# Patient Record
Sex: Female | Born: 1985 | Race: White | Hispanic: No | Marital: Married
Health system: Southern US, Community
[De-identification: ages and names within clinical notes are randomized; demographics above are authoritative.]

---

## 2014-10-10 ENCOUNTER — Encounter (HOSPITAL_COMMUNITY): Payer: Self-pay | Admitting: Obstetrics and Gynecology

## 2014-10-10 ENCOUNTER — Other Ambulatory Visit (HOSPITAL_COMMUNITY): Payer: Self-pay | Admitting: Obstetrics and Gynecology

## 2014-10-10 DIAGNOSIS — O283 Abnormal ultrasonic finding on antenatal screening of mother: Secondary | ICD-10-CM

## 2014-10-10 DIAGNOSIS — Z3689 Encounter for other specified antenatal screening: Secondary | ICD-10-CM

## 2014-10-10 DIAGNOSIS — Z3A18 18 weeks gestation of pregnancy: Secondary | ICD-10-CM

## 2014-10-16 ENCOUNTER — Encounter (HOSPITAL_COMMUNITY): Payer: Self-pay

## 2014-10-16 ENCOUNTER — Other Ambulatory Visit (HOSPITAL_COMMUNITY): Payer: Self-pay | Admitting: Obstetrics and Gynecology

## 2014-10-16 ENCOUNTER — Ambulatory Visit (HOSPITAL_COMMUNITY)
Admission: RE | Admit: 2014-10-16 | Discharge: 2014-10-16 | Disposition: A | Discharge status: Home / Self Care | Payer: 59 | Source: Ambulatory Visit | Attending: Obstetrics and Gynecology | Admitting: Obstetrics and Gynecology

## 2014-10-16 VITALS — BP 119/75 | HR 93 | Wt 191.0 lb

## 2014-10-16 DIAGNOSIS — IMO0001 Reserved for inherently not codable concepts without codable children: Secondary | ICD-10-CM

## 2014-10-16 DIAGNOSIS — Z36 Encounter for antenatal screening of mother: Secondary | ICD-10-CM | POA: Diagnosis not present

## 2014-10-16 DIAGNOSIS — Z3A18 18 weeks gestation of pregnancy: Secondary | ICD-10-CM | POA: Insufficient documentation

## 2014-10-16 DIAGNOSIS — Z3689 Encounter for other specified antenatal screening: Secondary | ICD-10-CM

## 2014-10-16 DIAGNOSIS — O359XX1 Maternal care for (suspected) fetal abnormality and damage, unspecified, fetus 1: Secondary | ICD-10-CM

## 2014-10-16 DIAGNOSIS — O358XX0 Maternal care for other (suspected) fetal abnormality and damage, not applicable or unspecified: Secondary | ICD-10-CM | POA: Diagnosis present

## 2014-10-16 DIAGNOSIS — O283 Abnormal ultrasonic finding on antenatal screening of mother: Secondary | ICD-10-CM

## 2014-10-17 ENCOUNTER — Encounter (HOSPITAL_COMMUNITY): Payer: Self-pay | Admitting: Obstetrics and Gynecology

## 2014-10-17 ENCOUNTER — Other Ambulatory Visit (HOSPITAL_COMMUNITY): Payer: Self-pay | Admitting: Obstetrics and Gynecology

## 2014-10-22 ENCOUNTER — Other Ambulatory Visit (HOSPITAL_COMMUNITY): Payer: Self-pay | Admitting: Obstetrics and Gynecology

## 2014-10-24 ENCOUNTER — Other Ambulatory Visit (HOSPITAL_COMMUNITY): Payer: Self-pay | Admitting: Obstetrics and Gynecology

## 2014-10-24 ENCOUNTER — Telehealth (HOSPITAL_COMMUNITY): Payer: Self-pay | Admitting: MS"

## 2014-10-24 NOTE — Telephone Encounter (Signed)
Left message for patient to return call.   Paige Jackson Paige Jackson 10/24/2014 8:48 AM

## 2014-10-24 NOTE — Telephone Encounter (Signed)
Called Paige Jackson to discuss her prenatal cell free DNA test results.  [name] had Panorama testing through [facility].  Testing was offered because of ultrasound findings.   The patient was identified by name and DOB.  We reviewed that these are within normal limits, showing a less than 1 in 10,000 risk for trisomies 21, 18 and 13, and monosomy X (Turner syndrome).  In addition, the risk for triploidy/vanishing twin and sex chromosome trisomies (47,XXX and 47,XXY) was also low risk.  We reviewed that this testing identifies > 99% of pregnancies with trisomy 48, trisomy 38, sex chromosome trisomies (47,XXX and 47,XXY), and triploidy. The detection rate for trisomy 18 is 96%.  The detection rate for monosomy X is ~92%.  The false positive rate is <0.1% for all conditions. Testing was also consistent with female fetal sex.  She understands that this testing does not identify all genetic conditions.  All questions were answered to her satisfaction, she was encouraged to call with additional questions or concerns.  Quinn Plowman, MS Patent attorney

## 2014-11-14 ENCOUNTER — Ambulatory Visit (HOSPITAL_COMMUNITY)
Admission: RE | Admit: 2014-11-14 | Discharge: 2014-11-14 | Disposition: A | Discharge status: Home / Self Care | Payer: 59 | Source: Ambulatory Visit | Attending: Obstetrics and Gynecology | Admitting: Obstetrics and Gynecology

## 2014-11-14 ENCOUNTER — Encounter (HOSPITAL_COMMUNITY): Payer: Self-pay

## 2014-11-14 DIAGNOSIS — O283 Abnormal ultrasonic finding on antenatal screening of mother: Secondary | ICD-10-CM

## 2014-11-14 DIAGNOSIS — Z3A23 23 weeks gestation of pregnancy: Secondary | ICD-10-CM | POA: Diagnosis not present

## 2014-11-14 DIAGNOSIS — IMO0001 Reserved for inherently not codable concepts without codable children: Secondary | ICD-10-CM

## 2014-11-14 DIAGNOSIS — Z315 Encounter for genetic counseling: Secondary | ICD-10-CM | POA: Insufficient documentation

## 2014-11-14 NOTE — Progress Notes (Signed)
Genetic Counseling  High-Risk Gestation Note  Appointment Date:  11/14/2014 Referred By: Sharlynn Oliphant, DO Date of Birth:  02-02-1985 Partner:  Paige Jackson   Pregnancy History: G1P0000 Estimated Date of Delivery: 03/12/15 Estimated Gestational Age: 56w1dAttending: PBenjaman Lobe MD   I met with Mrs. SHeloise Purpuraand her husband, Paige Jackson for genetic counseling because of abnormal ultrasound findings.  In Summary:  Fetal ultrasound finding of abnormal spine curvature (kyphoscoliosis of thoracic spine) and single umbilical artery  Reviewed chances for isolated versus associated anomalies or conditions  Low risk prenatal cell free DNA testing performed (Panorama)  Patient declined amniocentesis  May be interested in prenatal consultation with pediatric orthopedics but would prefer to defer until later in pregnancy  Follow-up ultrasound scheduled for 11/23; Fetal echocardiogram scheduled for 11/11  Family history significant for congenital heart defect and individuals with blindness; See detailed discussion below  We began by reviewing the ultrasound in detail. Abnormal curvature of the spine (kyphoscoliosis of the thoracic spine) is visualized. No signs suggestive of an open neural tube defect were visualized today or on the previous ultrasound. Single umbilical artery is present. Stomach bubble was not able to be visualized at this time, but was visualized and was normal during the previous ultrasound on 10/16/14. Complete ultrasound results reported separately.   Congenital scoliosis describes an abnormal lateral spine angulation, and kyphosis describes abnormal anterior spine angulation. Congenital scoliosis is estimated to occur in approximately 1 in 10,000 newborns.  It can occur as an isolated vertebral anomaly but also has a high association with additional anomalies. We discussed that spina bifida and VACTERL association are the two more commonly reported  conditions to be associated with fetal kyphoscoliosis. Less commonly, underlying chromosome or single gene conditions can have congenital kyphoscoliosis as a features.   Single umbilical artery occurs in approximately 1 in every 200 pregnancies and is defined as the presence of one umbilical artery and one umbilical vein, instead of the typical three vessel cord containing two arteries and one vein.  The finding of SUA alone is not necessarily considered to increase the risk for fetal aneuploidy but can be seen as a supporting risk factor for fetal aneuploidy when present with additional findings or risks. The literature is conflicting regarding an association between this finding and congenital heart defects.  In addition, we discussed that SUA is known to be associated with an increased risk for fetal growth restriction.  We reviewed the options of serial ultrasound to monitor fetal growth and a fetal echocardiogram.    We reviewed chromosomes, genes, and various patterns of inheritance. Regarding fetal aneuploidy, we reviewed that Mrs. SJezelle Gullickpreviously had prenatal cell free DNA testing (Panorama) performed, which was within normal range for the conditions screened. We reviewed the conditions for which it assessed and the detection rates for each. The couple understands that NIPS is not diagnostic and does not assess for all chromosome conditions. We discussed the option of amniocentesis including the associated risks, benefits, and limitations. We reviewed the associated 1 in [id number] for complications from amniocentesis, including spontaneous pregnancy loss. We reviewed the detection rate for open neural tube defects from amniocentesis and that this can also assess for chromosome conditions, as well as smaller chromosome aberrations (in the case of chromosomal microarray analysis). They understand that single gene conditions are not typically assessed by amniocentesis unless ultrasound and/or  family history are suggestive of a particular condition for which clinical genetic testing is available.  Paige Jackson declined amniocentesis in the pregnancy given the associated risk for complications. They understand that ultrasound cannot diagnose or rule out all birth defect or genetic conditions prenatally.   We discussed that prognosis would depend upon whether or not kyphoscoliosis is isolated or associated with additional anomalies and/or an underlying condition. When visualized prenatally, it is estimated that 25% of cases have no curve progression, 50% have slow curve progression, and 25% have rapid curve progression. Treatment of congenital kyphoscoliosis may include surgical and/or non surgical treatment. We discussed the option of meeting prenatally with pediatric orthopedics to discuss management options. The patient is interested in this option but would prefer to defer until later in the pregnancy. Follow-up ultrasound is scheduled for 11/23, and fetal echocardiogram is scheduled for 11/28/14 with [facility].   Both family histories were reviewed and found to be contributory for congenital heart defect for Paige Jackson' maternal first cousin. He reported that this cousin had surgery in her early 37's and is otherwise healthy. No underlying cause was known for the congenital heart defect. Congenital heart defects occur in approximately 1% of pregnancies.  Congenital heart defects may occur due to multifactorial influences, chromosomal abnormalities, genetic syndromes or environmental exposures.  Isolated heart defects are generally multifactorial.  Given the reported family history and assuming multifactorial inheritance, the risk for a congenital heart defect in the current pregnancy does not appear to be increased above the general population risk.  Additionally, Paige Jackson reported two paternal uncles (out of a total of 59 males in the sibship) who had a  form of vision loss with onset in their 50's. They reportedly are currently legally blind and unable to drive as a result and are currently in their 60's. The patient reported that her father passed away at age 40 years old from pancreatic cancer but did not have symptoms of vision loss. We discussed that there can be various causes for vision loss. Regarding genetic causes, there are autosomal recessive, autosomal dominant, and X-linked forms of hereditary vision loss. Given the reported family history and that the patient's father was reportedly most likely unaffected, we discussed that recurrence risk would likely be low for the patient. However, additional information regarding the family history and the specific etiology for these uncles may further refine recurrence risk. We discussed that it may be helpful for Paige Jackson to inform her physician of this family history so that she and her relatives are screened and followed appropriately.   Additionally, Paige Jackson reported that all of her maternal aunt's children had mental differences from relatives. She reported that this maternal aunt is an alcoholic and likely drank alcohol during all pregnancies. We reviewed the association with prenatal alcohol exposure and increased risk for intellectual disability and behavior differences. In the case of a teratogenic cause, recurrence risk would not be increased for relatives. Additional information may alter recurrence risk assessment. Additionally, Paige Jackson reported that one the children may have had Down syndrome. The patient did not have contact with this relative and reported that he was adopted out of the family. We discussed that 95% of cases of Down syndrome are not inherited and are the result of non-disjunction.  Three to 4% of cases of Down syndrome are the result of a translocation involving chromosome #21.  We discussed the option of chromosome analysis to determine if an individual is a carrier of a  balanced translocation involving chromosome #21.  If an individual carries a balanced  translocation involving chromosome #21, then the chance to have a baby with Down syndrome would be greater than the maternal age-related risk.  In the case that this relative had Down syndrome, sporadic occurrence is most likely given the reported family history. However, additional information regarding his etiology and/or a different condition may alter recurrence risk estimate for relatives. Without further information regarding the provided family history, an accurate genetic risk cannot be calculated. Further genetic counseling is warranted if more information is obtained.  Paige Jackson was provided with written information regarding cystic fibrosis (CF) including the carrier frequency and incidence in the Caucasian population, the availability of carrier testing and prenatal diagnosis if indicated.  In addition, we discussed that CF is routinely screened for as part of the [place] newborn screening panel.  She declined CF carrier testing today.   Paige Jackson denied exposure to environmental toxins or chemical agents. She denied the use of alcohol, tobacco or street drugs. She denied significant viral illnesses during the course of her pregnancy. Her medical and surgical histories were noncontributory.   I counseled this couple regarding the above risks and available options.  The approximate face-to-face time with the genetic counselor was 40 minutes.  Chipper Oman, MS Certified Genetic Counselor 11/14/2014

## 2014-12-10 ENCOUNTER — Encounter (HOSPITAL_COMMUNITY): Payer: Self-pay

## 2014-12-10 ENCOUNTER — Ambulatory Visit (HOSPITAL_COMMUNITY)
Admission: RE | Admit: 2014-12-10 | Discharge: 2014-12-10 | Disposition: A | Discharge status: Home / Self Care | Payer: 59 | Source: Ambulatory Visit | Attending: Obstetrics and Gynecology | Admitting: Obstetrics and Gynecology

## 2014-12-10 ENCOUNTER — Other Ambulatory Visit (HOSPITAL_COMMUNITY): Payer: Self-pay | Admitting: Maternal and Fetal Medicine

## 2014-12-10 DIAGNOSIS — O283 Abnormal ultrasonic finding on antenatal screening of mother: Secondary | ICD-10-CM

## 2014-12-10 DIAGNOSIS — Z3A26 26 weeks gestation of pregnancy: Secondary | ICD-10-CM

## 2014-12-10 DIAGNOSIS — O359XX0 Maternal care for (suspected) fetal abnormality and damage, unspecified, not applicable or unspecified: Secondary | ICD-10-CM

## 2014-12-10 DIAGNOSIS — O358XX0 Maternal care for other (suspected) fetal abnormality and damage, not applicable or unspecified: Secondary | ICD-10-CM | POA: Insufficient documentation

## 2014-12-10 DIAGNOSIS — IMO0001 Reserved for inherently not codable concepts without codable children: Secondary | ICD-10-CM

## 2014-12-18 ENCOUNTER — Encounter (HOSPITAL_COMMUNITY): Payer: Self-pay

## 2015-01-09 ENCOUNTER — Ambulatory Visit (HOSPITAL_COMMUNITY): Payer: 59

## 2015-08-21 ENCOUNTER — Encounter (HOSPITAL_COMMUNITY): Payer: Self-pay

## 2016-12-29 IMAGING — US US MFM OB DETAIL+14 WK
1 series · 13 of 28 positions shown · non-contrast
Comparison: none

[Series 1: us mfm ob detail+14 wk · 13 of 105 slices shown]
[im 4/105]
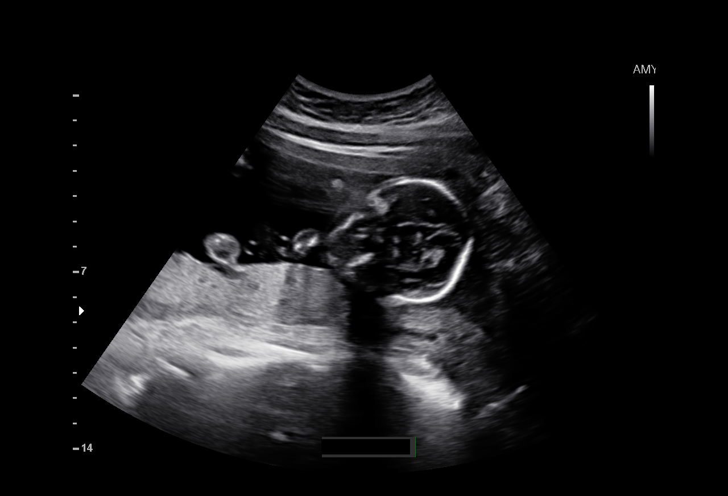
[im 12/105]
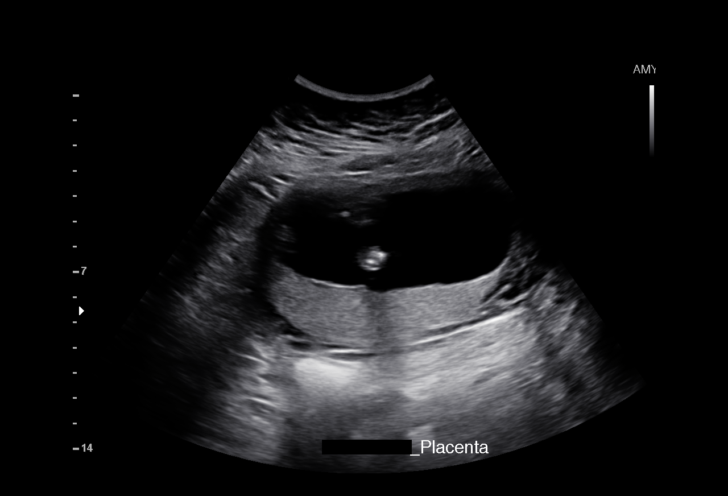
[im 20/105]
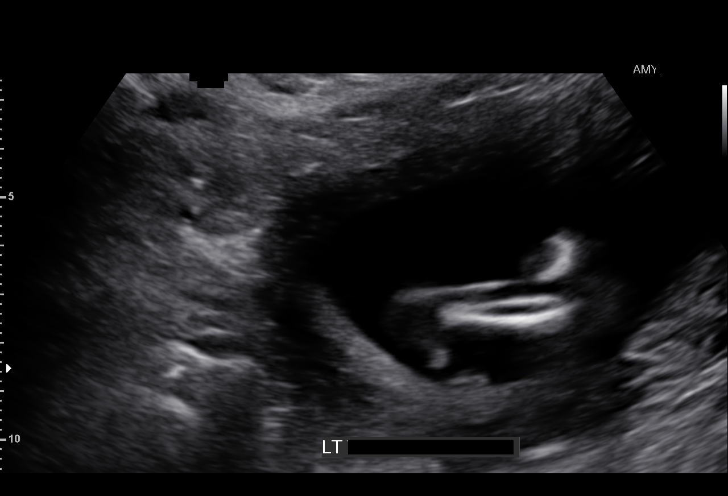
[im 27/105]
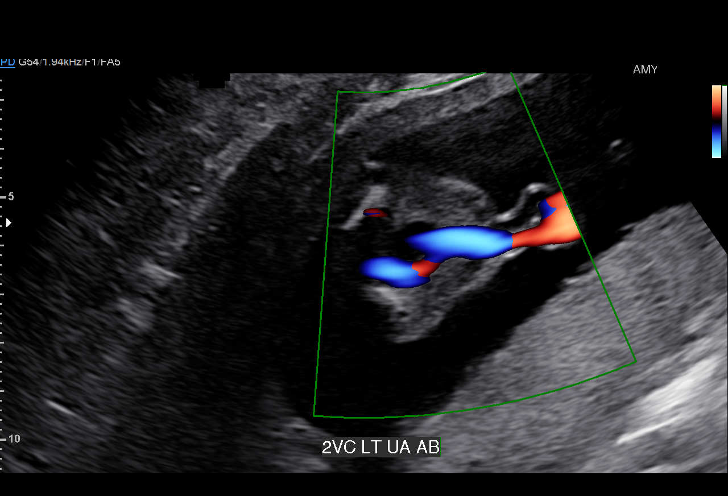
[im 35/105]
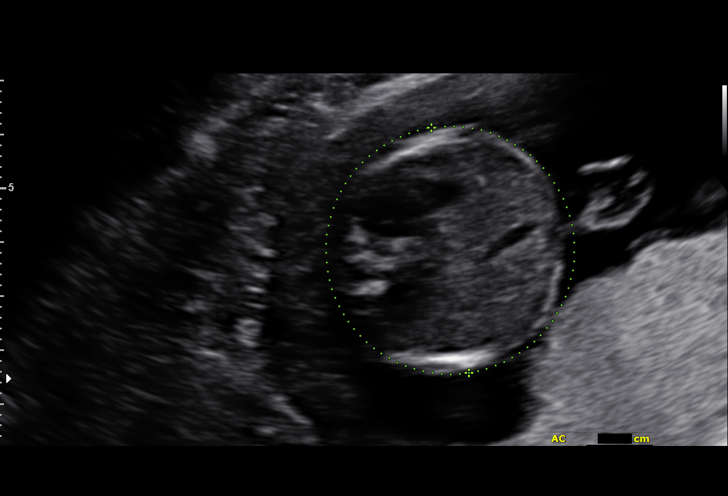
[im 43/105]
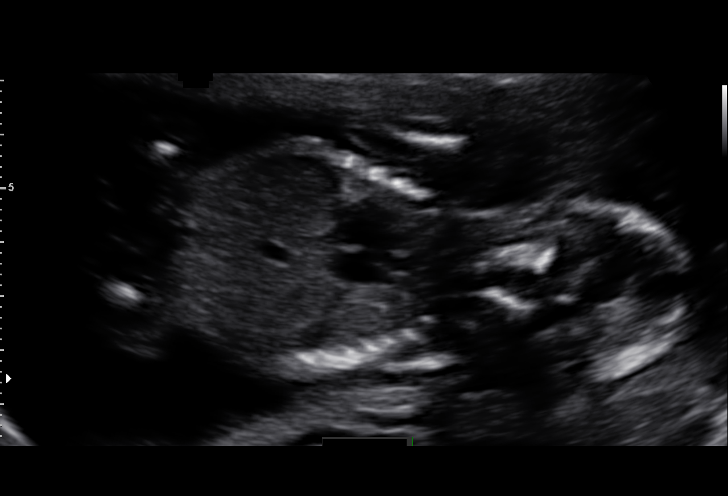
[im 54/105]
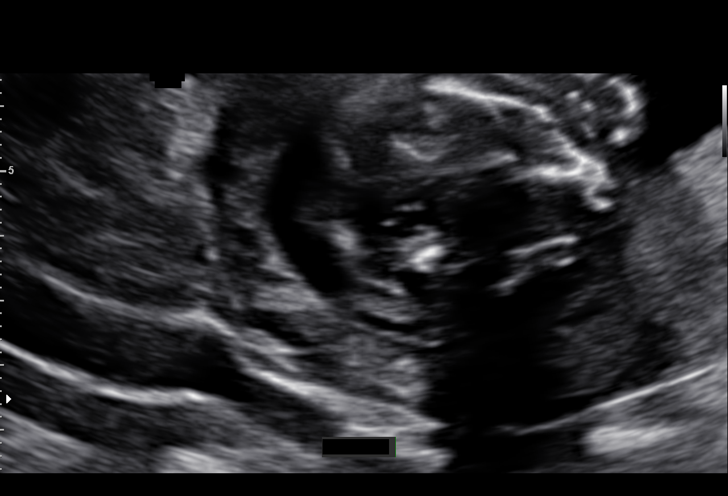
[im 62/105]
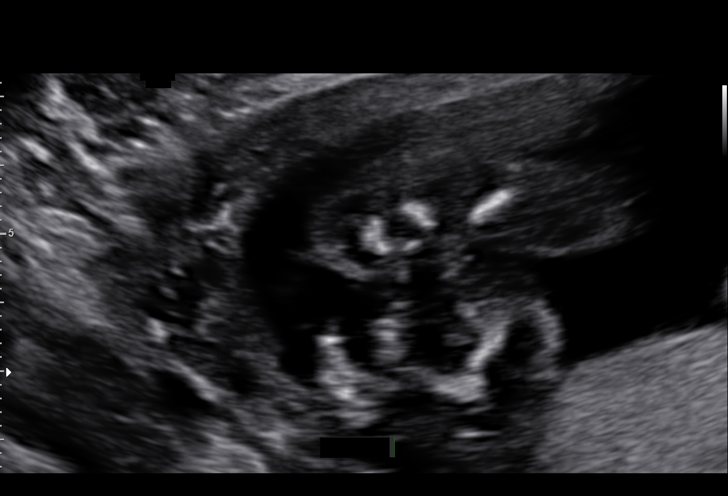
[im 70/105]
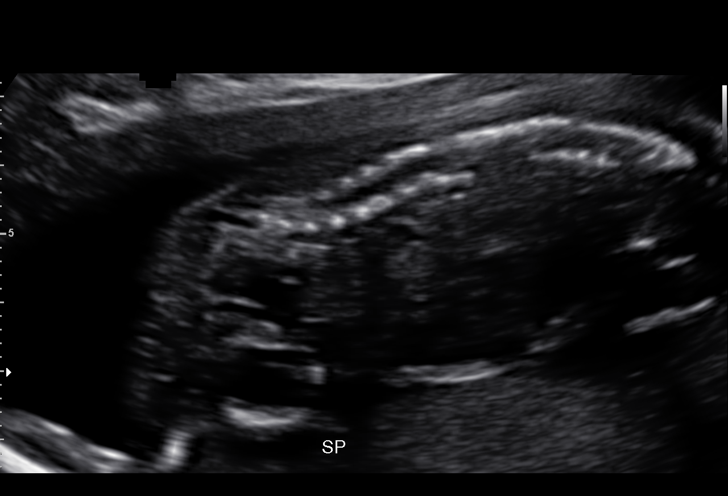
[im 78/105]
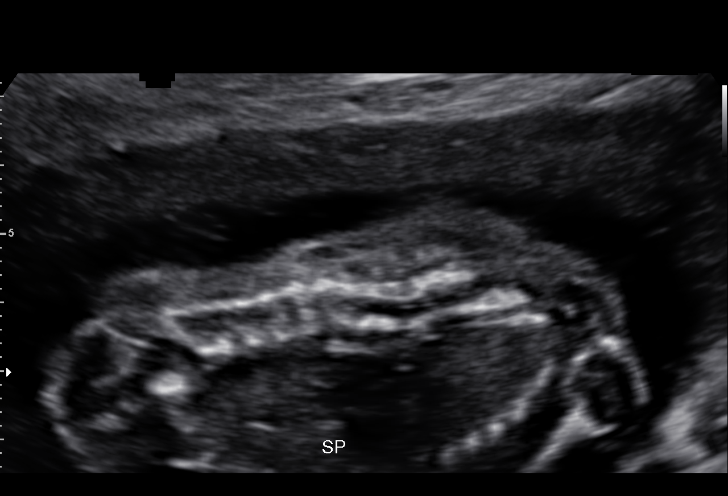
[im 85/105]
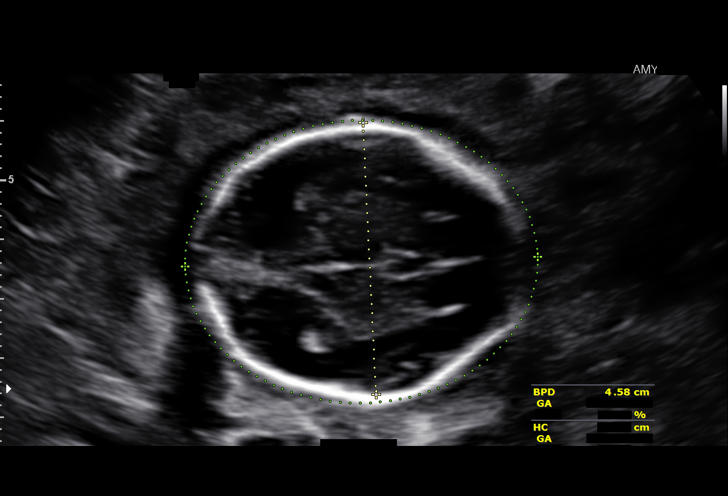
[im 93/105]
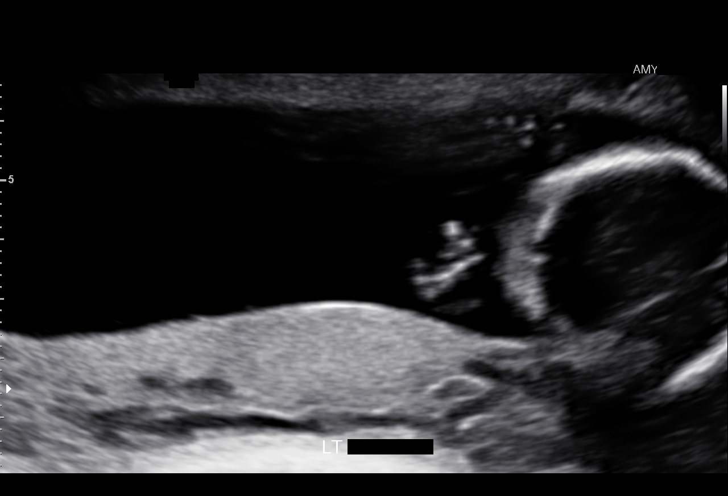
[im 101/105]
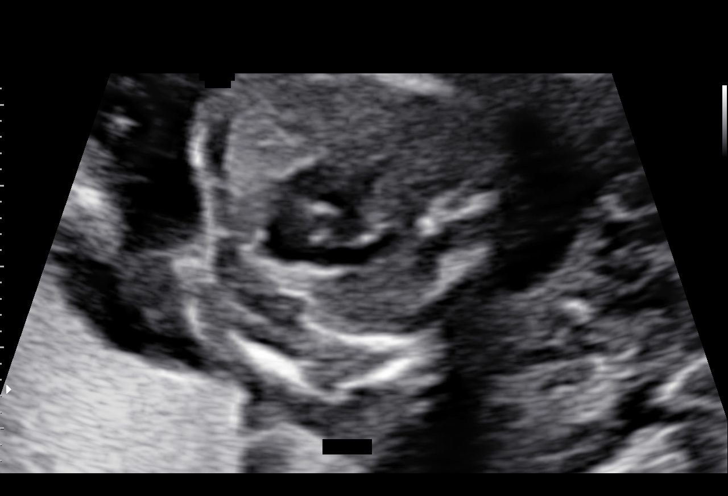

[13 of 28 positions shown; findings below may reference images not displayed]

OBSTETRICS REPORT
(Signed Final 10/16/2014 [DATE])

Name:       FARUQUE TINU                       Visit  10/16/2014 [DATE]
Date:

Service(s) Provided

Indications

Detailed fetal anatomic survey                         Z36
18 weeks gestation of pregnancy
Fetal abnormality - other known or suspected
(Spine)
Fetal Evaluation

Num Of             1
Fetuses:
Fetal Heart        144                          bpm
Rate:
Cardiac Activity:  Observed
Presentation:      Cephalic
Placenta:          Posterior, above cervical
os
P. Cord            Visualized
Insertion:

Amniotic Fluid
AFI FV:      Subjectively within normal limits
Larg Pckt:      4.8  cm
Biometry

BPD:     46.1   m    G. Age:   20w 0d                 CI:        75.61   70 - 86
m
FL/HC:      17.7   16.1 -
18.3
HC:     168.1   m    G. Age:   19w 4d        65  %    HC/AC:      1.17   1.09 -
m
AC:     143.9   m    G. Age:   19w 5d        69  %    FL/BPD
m                                     :
FL:      29.7   m    G. Age:   19w 1d        49  %    FL/AC:      20.6   20 - 24
m
HUM:       28   m    G. Age:   19w 0d        50  %
m
CER:       18   m    G. Age:   18w 0d        20  %
m
NFT:       3.4  m
m

Est.         295   gm   0 lb 10 oz      53   %
FW:
Gestational Age

LMP:           20w 1d        Date:  05/28/14                  EDD:   03/04/15
U/S Today:     19w 4d                                         EDD:   03/08/15
Best:          19w 0d    Det. By:   Early Ultrasound          EDD:   03/12/15
(08/01/14)
Anatomy

Cranium:          Appears normal         Aortic Arch:       Not well visualized
Fetal Cavum:      Appears normal         Ductal Arch:       Appears normal
Ventricles:       Appears normal         Diaphragm:         Appears normal
Choroid Plexus:   Appears normal         Stomach:           Appears normal,
left sided
Cerebellum:       Appears normal         Abdomen:           Appears normal
Posterior         Appears normal         Abdominal          Appears nml (cord
Fossa:                                   Wall:              insert, abd wall)
Nuchal Fold:      Appears normal         Cord Vessels:      2 vessel cord,
absent Alenezi Angkad
Streeking
Face:             Appears normal         Kidneys:           Appear normal
(orbits and profile)
Lips:             Appears normal         Bladder:           Appears normal
Palate:           Appears normal         Spine:             Abnormal, see
comments
Heart:            Not well visualized    Lower              Appears normal
Extremities:
RVOT:             Appears normal         Upper              Appears normal
Extremities:
LVOT:             Appears normal

Other:   Fetus appears to be a female. Heels and 5th digit appear normal.
Targeted Anatomy

Fetal Central Nervous System
Lat. Ventricles:  6.5                    Cisterna
Magna:
Cervix Uterus Adnexa

Cervical Length:    3.51      cm

Cervix:       Normal appearance by transabdominal scan.
Appears closed, without funnelling.

Left Ovary:    Within normal limits.
Right Ovary:   Not visualized. No adnexal mass visualized.
Impression

Single IUP at 19w 0d
Abnormal curvature of the spine is noted - kyphoscoliosis
of the thoracic spine
No evidence of ONTD
Normal cranial anatomy is appreciated
A single umbilical artery is noted
Limited views of the fetal heart were obtained
The remainder of the fetal anatomy appears normal
No limb contractures were appreciated
Normal amniotic fluid volume

The findings and limitations of the study were discussed.
While there does not appear to be an open spinal defect or
limb contractures, the spinal kyphoscoliosis and single
umbilical artery are somewhat concerning for a genetic
syndrome.  The patient elected to undergo cell free fetal
DNA (sequential screen was low risk for aneuploidy)
Recommendations

Fetal echo with Peds cardiology
Cell free fetal DNA
Ultrasound for interval growth and reevaluation in 4 weeks
Genetic counseling when the patient returns for her follow
up ultrasound
